# Patient Record
Sex: Female | Born: 1970 | Hispanic: Yes | Marital: Married | Smoking: Never smoker
Health system: Southern US, Community
[De-identification: ages and names within clinical notes are randomized; demographics above are authoritative.]

## PROBLEM LIST (undated history)

## (undated) DIAGNOSIS — L309 Dermatitis, unspecified: Secondary | ICD-10-CM

## (undated) DIAGNOSIS — Z8041 Family history of malignant neoplasm of ovary: Secondary | ICD-10-CM

## (undated) DIAGNOSIS — Z8481 Family history of carrier of genetic disease: Secondary | ICD-10-CM

## (undated) HISTORY — DX: Family history of carrier of genetic disease: Z84.81

## (undated) HISTORY — DX: Dermatitis, unspecified: L30.9

## (undated) HISTORY — PX: FACIAL COSMETIC SURGERY: SHX629

## (undated) HISTORY — DX: Family history of malignant neoplasm of ovary: Z80.41

## (undated) HISTORY — PX: AUGMENTATION MAMMAPLASTY: SUR837

---

## 2013-04-19 ENCOUNTER — Other Ambulatory Visit (HOSPITAL_COMMUNITY): Payer: Self-pay | Admitting: Family Medicine

## 2013-04-19 ENCOUNTER — Ambulatory Visit (HOSPITAL_COMMUNITY)
Admission: RE | Admit: 2013-04-19 | Discharge: 2013-04-19 | Disposition: A | Discharge status: Home / Self Care | Payer: Medicaid Other | Source: Ambulatory Visit | Attending: Family Medicine | Admitting: Family Medicine

## 2013-04-19 DIAGNOSIS — M5137 Other intervertebral disc degeneration, lumbosacral region: Secondary | ICD-10-CM | POA: Insufficient documentation

## 2013-04-19 DIAGNOSIS — M51379 Other intervertebral disc degeneration, lumbosacral region without mention of lumbar back pain or lower extremity pain: Secondary | ICD-10-CM | POA: Insufficient documentation

## 2013-04-19 DIAGNOSIS — R52 Pain, unspecified: Secondary | ICD-10-CM

## 2013-04-19 DIAGNOSIS — M4310 Spondylolisthesis, site unspecified: Secondary | ICD-10-CM | POA: Insufficient documentation

## 2013-05-02 ENCOUNTER — Ambulatory Visit (HOSPITAL_COMMUNITY)
Admission: RE | Admit: 2013-05-02 | Discharge: 2013-05-02 | Disposition: A | Discharge status: Home / Self Care | Payer: Medicaid Other | Source: Ambulatory Visit | Attending: Internal Medicine | Admitting: Internal Medicine

## 2013-05-02 ENCOUNTER — Other Ambulatory Visit (HOSPITAL_COMMUNITY): Payer: Self-pay | Admitting: Internal Medicine

## 2013-05-02 DIAGNOSIS — R52 Pain, unspecified: Secondary | ICD-10-CM

## 2013-05-02 DIAGNOSIS — M542 Cervicalgia: Secondary | ICD-10-CM | POA: Insufficient documentation

## 2015-07-07 ENCOUNTER — Other Ambulatory Visit: Payer: Self-pay | Admitting: Obstetrics and Gynecology

## 2015-07-07 ENCOUNTER — Other Ambulatory Visit (HOSPITAL_COMMUNITY)
Admission: RE | Admit: 2015-07-07 | Discharge: 2015-07-07 | Disposition: A | Discharge status: Home / Self Care | Payer: BLUE CROSS/BLUE SHIELD | Source: Ambulatory Visit | Attending: Obstetrics and Gynecology | Admitting: Obstetrics and Gynecology

## 2015-07-07 ENCOUNTER — Other Ambulatory Visit: Payer: Self-pay

## 2015-07-07 DIAGNOSIS — Z01419 Encounter for gynecological examination (general) (routine) without abnormal findings: Secondary | ICD-10-CM | POA: Diagnosis present

## 2015-07-07 DIAGNOSIS — Z1151 Encounter for screening for human papillomavirus (HPV): Secondary | ICD-10-CM | POA: Insufficient documentation

## 2015-07-07 DIAGNOSIS — Z1231 Encounter for screening mammogram for malignant neoplasm of breast: Secondary | ICD-10-CM

## 2015-07-08 LAB — CYTOLOGY - PAP

## 2015-07-14 ENCOUNTER — Ambulatory Visit
Admission: RE | Admit: 2015-07-14 | Discharge: 2015-07-14 | Disposition: A | Discharge status: Home / Self Care | Payer: BLUE CROSS/BLUE SHIELD | Source: Ambulatory Visit

## 2015-07-14 DIAGNOSIS — Z1231 Encounter for screening mammogram for malignant neoplasm of breast: Secondary | ICD-10-CM

## 2016-07-12 ENCOUNTER — Other Ambulatory Visit: Payer: Self-pay | Admitting: Obstetrics and Gynecology

## 2016-07-12 DIAGNOSIS — Z1231 Encounter for screening mammogram for malignant neoplasm of breast: Secondary | ICD-10-CM

## 2016-07-12 DIAGNOSIS — Z01419 Encounter for gynecological examination (general) (routine) without abnormal findings: Secondary | ICD-10-CM | POA: Diagnosis not present

## 2016-07-19 ENCOUNTER — Ambulatory Visit
Admission: RE | Admit: 2016-07-19 | Discharge: 2016-07-19 | Disposition: A | Discharge status: Home / Self Care | Payer: BLUE CROSS/BLUE SHIELD | Source: Ambulatory Visit | Attending: Obstetrics and Gynecology | Admitting: Obstetrics and Gynecology

## 2016-07-19 DIAGNOSIS — Z1231 Encounter for screening mammogram for malignant neoplasm of breast: Secondary | ICD-10-CM

## 2016-08-23 ENCOUNTER — Encounter: Payer: Self-pay | Admitting: Genetic Counselor

## 2016-08-23 ENCOUNTER — Telehealth: Payer: Self-pay | Admitting: Genetic Counselor

## 2016-08-23 NOTE — Telephone Encounter (Signed)
Pt confirmed appt, verified demo and insurance, mailed pt letter, faxed referring provider appt date/time. °

## 2016-10-04 ENCOUNTER — Ambulatory Visit (HOSPITAL_BASED_OUTPATIENT_CLINIC_OR_DEPARTMENT_OTHER): Payer: BLUE CROSS/BLUE SHIELD | Admitting: Genetic Counselor

## 2016-10-04 ENCOUNTER — Encounter: Payer: Self-pay | Admitting: Genetic Counselor

## 2016-10-04 ENCOUNTER — Other Ambulatory Visit: Payer: BLUE CROSS/BLUE SHIELD

## 2016-10-04 DIAGNOSIS — Z8481 Family history of carrier of genetic disease: Secondary | ICD-10-CM | POA: Insufficient documentation

## 2016-10-04 DIAGNOSIS — Z315 Encounter for genetic counseling: Secondary | ICD-10-CM

## 2016-10-04 DIAGNOSIS — Z8041 Family history of malignant neoplasm of ovary: Secondary | ICD-10-CM | POA: Diagnosis not present

## 2016-10-04 NOTE — Progress Notes (Signed)
REFERRING PROVIDER: Arnoldo Morale, MD [place], [place] [address]   Christophe Louis, MD  PRIMARY PROVIDER:  Arnoldo Morale, MD  PRIMARY REASON FOR VISIT:  1. Family history of breast cancer gene mutation in first degree relative   2. Family history of ovarian cancer      HISTORY OF PRESENT ILLNESS:   Ms. Freeburg, a 45 y.o. female, was seen for a [facility] cancer genetics consultation at the request of Dr. Landry Mellow due to a family history of cancer.  Ms. Cumberland presents to clinic today to discuss the possibility of a hereditary predisposition to cancer, genetic testing, and to further clarify her future cancer risks, as well as potential cancer risks for family members. Ms. Vallely is a 45 y.o. female with no personal history of cancer.    CANCER HISTORY:   No history exists.     HORMONAL RISK FACTORS:  Menarche was at age 4.  First live birth at age 36.  OCP use for approximately 1 years.  Ovaries intact: yes.  Hysterectomy: no.  Menopausal status: premenopausal.  HRT use: 0 years. Colonoscopy: no; not examined. Mammogram within the last year: yes. Number of breast biopsies: 0. Up to date with pelvic exams:  yes. Any excessive radiation exposure in the past:  no  Past Medical History:  Diagnosis Date  . Family history of breast cancer gene mutation in first degree relative   . Family history of ovarian cancer     History reviewed. No pertinent surgical history.  Social History   Social History  . Marital status: Married    Spouse name: N/A  . Number of children: N/A  . Years of education: N/A   Social History Main Topics  . Smoking status: Never Smoker  . Smokeless tobacco: Never Used  . Alcohol use No  . Drug use: No  . Sexual activity: Not Asked   Other Topics Concern  . None   Social History Narrative  . None     FAMILY HISTORY:  We obtained a detailed, 4-generation family history.  Significant diagnoses are listed below: Family History   Problem Relation Age of Onset  . Ovarian cancer Mother 37  . Breast cancer Mother 67  . Lung cancer Maternal Uncle   . Stroke Maternal Grandmother 73  . Thyroid cancer Maternal Grandfather 97  . Heart attack Paternal Grandfather     The patient has three sons who are cancer free.  She has a full sister and maternal half sister who are cancer free.  Her mother was diagnosed with ovarian cancer at 31 and breast cancer at 72.  Her father died at 94 from the flu.  He was in [place] at the time.  Her mother has multiple siblings, but Ms. Holecek is aware of three of them - two brothers and a sister.  One brother was a smoker and died of lung cancer.  Her maternal grandfather died of thyroid cancer at 34 and her grandmother died of a stroke.  Ms. Genco father had two sisters and three brothers, and some may have been half siblings.  She is not aware of cancer on the paternal side of the family.  Ms. Flaim is unaware of previous family history of genetic testing for hereditary cancer risks. Patient's maternal ancestors are of [place] descent, and paternal ancestors are of [place] descent. There is no reported Ashkenazi Jewish ancestry. There is no known consanguinity.  GENETIC COUNSELING ASSESSMENT: Shevawn Langenberg is a 45 y.o.  female with a family history of breast and ovarian cancer which is somewhat suggestive of a hereditary breast cancer syndrome and predisposition to cancer. We, therefore, discussed and recommended the following at today's visit.   DISCUSSION: We discussed that about 5-10% of breast cancer and about 20% of ovarian cancer is due to hereditary syndromes, most commonly BRCA mutations.  We reviewed that there are other genes that can increase the risk for both breast and ovarian cancer.  All of her family was in [place] until 1989.  That is when her mother brought she and her sisters here and settled in [place].  Therefore, they were in [place] during the war.  We discussed that  there could have been chemicals that the family was exposed to that could have caused their cancer.  The only way to determine if her mother developed her cancer due to a hereditary cause is to have her undergo genetic testing.  Ms. Guthridge states that her mother is sick now and she does not want to ask her to undergo genetic testing at this time.  We discussed that her mother is the best person to test, and therefore if Ms. Bastedo is negative, that her mother should undergo genetic testing in order to get clarification of risk not only for Ms. Kanaan but also her sisters.  Ms. Rottman understands and will talk with her mother once she is through her chemotherapy.  In the meantime we can test Ms. Cameron Ali.  We reviewed the characteristics, features and inheritance patterns of hereditary cancer syndromes. We also discussed genetic testing, including the appropriate family members to test, the process of testing, insurance coverage and turn-around-time for results. We discussed the implications of a negative, positive and/or variant of uncertain significant result. We recommended Ms. Foulk pursue genetic testing for the Breast/Ovarian cancer gene panel. The Breast/Ovarian gene panel offered by [facility] includes sequencing and rearrangement analysis for the following 20 genes:  ATM, BARD1, BRCA1, BRCA2, BRIP1, CDH1, CHEK2, EPCAM, FANCC, MLH1, MSH2, MSH6, NBN, PALB2, PMS2, PTEN, RAD51C, RAD51D, TP53, and XRCC2.     Based on Ms. Gopal's personal and family history of cancer, she meets medical criteria for genetic testing. Despite that she meets criteria, she may still have an out of pocket cost. We discussed that if her out of pocket cost for testing is over $100, the laboratory will call and confirm whether she wants to proceed with testing.  If the out of pocket cost of testing is less than $100 she will be billed by the genetic testing laboratory.   PLAN: After considering the risks, benefits, and limitations, Ms. Dick   provided informed consent to pursue genetic testing and the blood sample was sent to [facility] for analysis of the Breast/Ovarian cancer panel. Results should be available within approximately 2-3 weeks' time, at which point they will be disclosed by telephone to Ms. Nikkel, as will any additional recommendations warranted by these results. Ms. Oppenheimer will receive a summary of her genetic counseling visit and a copy of her results once available. This information will also be available in Epic. We encouraged Ms. Cotterman to remain in contact with cancer genetics annually so that we can continuously update the family history and inform her of any changes in cancer genetics and testing that may be of benefit for her family. Ms. Abruzzese questions were answered to her satisfaction today. Our contact information was provided should additional questions or concerns arise.  Lastly, we encouraged Ms. Wrenn to remain in  contact with cancer genetics annually so that we can continuously update the family history and inform her of any changes in cancer genetics and testing that may be of benefit for this family.   Ms.  Donaway questions were answered to her satisfaction today. Our contact information was provided should additional questions or concerns arise. Thank you for the referral and allowing [place] to share in the care of your patient.   Aithana Kushner P. Florene Glen, [place], [facility] Certified Genetic Counselor [email or web address] phone: [phone]  The patient was seen for a total of 45 minutes in face-to-face genetic counseling.  This patient was discussed with Drs. Magrinat, Lindi Adie and/or Burr Medico who agrees with the above.    _______________________________________________________________________ For Office Staff:  Number of people involved in session: 1 Was an Intern/ student involved with case: no

## 2016-10-20 DIAGNOSIS — Z803 Family history of malignant neoplasm of breast: Secondary | ICD-10-CM | POA: Diagnosis not present

## 2016-10-20 DIAGNOSIS — Z8041 Family history of malignant neoplasm of ovary: Secondary | ICD-10-CM | POA: Diagnosis not present

## 2016-10-20 DIAGNOSIS — Z8481 Family history of carrier of genetic disease: Secondary | ICD-10-CM | POA: Diagnosis not present

## 2016-11-09 ENCOUNTER — Telehealth: Payer: Self-pay | Admitting: Genetic Counselor

## 2016-11-09 ENCOUNTER — Encounter: Payer: Self-pay | Admitting: Genetic Counselor

## 2016-11-09 DIAGNOSIS — Z1379 Encounter for other screening for genetic and chromosomal anomalies: Secondary | ICD-10-CM | POA: Insufficient documentation

## 2016-11-09 NOTE — Telephone Encounter (Signed)
No answer and no VM.  It just rang and rang.

## 2016-11-12 ENCOUNTER — Telehealth: Payer: Self-pay | Admitting: Genetic Counselor

## 2016-11-12 NOTE — Telephone Encounter (Signed)
Revealed negative genetic testing.  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  We do not change medical management based on this result.  We will let her know when it is reclassified.

## 2016-11-23 ENCOUNTER — Ambulatory Visit: Payer: Self-pay | Admitting: Genetic Counselor

## 2016-11-23 DIAGNOSIS — Z8041 Family history of malignant neoplasm of ovary: Secondary | ICD-10-CM

## 2016-11-23 DIAGNOSIS — Z1379 Encounter for other screening for genetic and chromosomal anomalies: Secondary | ICD-10-CM

## 2016-11-23 DIAGNOSIS — Z8481 Family history of carrier of genetic disease: Secondary | ICD-10-CM

## 2016-11-23 NOTE — Progress Notes (Signed)
HPI: Kelli Campbell was previously seen in the [facility] due to a family history of cancer and concerns regarding a hereditary predisposition to cancer. Please refer to our prior cancer genetics clinic note for more information regarding Kelli Campbell's medical, social and family histories, and our assessment and recommendations, at the time. Kelli Campbell's recent genetic test results were disclosed to her, as were recommendations warranted by these results. These results and recommendations are discussed in more detail below.  CANCER HISTORY:   No history exists.    FAMILY HISTORY:  We obtained a detailed, 4-generation family history.  Significant diagnoses are listed below: Family History  Problem Relation Age of Onset  . Ovarian cancer Mother 45  . Breast cancer Mother 62  . Lung cancer Maternal Uncle   . Stroke Maternal Grandmother 73  . Thyroid cancer Maternal Grandfather 55  . Heart attack Paternal Grandfather     The patient has three sons who are cancer free.  She has a full sister and maternal half sister who are cancer free.  Her mother was diagnosed with ovarian cancer at 45 and breast cancer at 62.  Her father died at 28 from the flu.  He was in [place] at the time.  Her mother has multiple siblings, but Kelli Campbell is aware of three of them - two brothers and a sister.  One brother was a smoker and died of lung cancer.  Her maternal grandfather died of thyroid cancer at 55 and her grandmother died of a stroke.  Kelli Campbell's father had two sisters and three brothers, and some may have been half siblings.  She is not aware of cancer on the paternal side of the family.  Kelli Campbell is unaware of previous family history of genetic testing for hereditary cancer risks. Patient's maternal ancestors are of [place] descent, and paternal ancestors are of [place] descent. There is no reported Ashkenazi Jewish ancestry. There is no known consanguinity.  GENETIC TEST  RESULTS: Genetic testing reported out on November 01, 2016 through the Breast/Ovarian cancer panel found no deleterious mutations.  The Breast/Ovarian gene panel offered by [facility] includes sequencing and rearrangement analysis for the following 20 genes:  ATM, BARD1, BRCA1, BRCA2, BRIP1, CDH1, CHEK2, EPCAM, FANCC, MLH1, MSH2, MSH6, NBN, PALB2, PMS2, PTEN, RAD51C, RAD51D, TP53, and XRCC2.   The test report has been scanned into EPIC and is located under the Molecular Pathology section of the Results Review tab.   We discussed with Kelli Campbell that since the current genetic testing is not perfect, it is possible there may be a gene mutation in one of these genes that current testing cannot detect, but that chance is small. We also discussed, that it is possible that another gene that has not yet been discovered, or that we have not yet tested, is responsible for the cancer diagnoses in the family, and it is, therefore, important to remain in touch with cancer genetics in the future so that we can continue to offer Kelli Campbell the most up to date genetic testing.   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 At this time, it is unknown if this variant is associated with increased cancer risk or if this is a normal finding, but most variants such as this get reclassified to being inconsequential. It should not be used to make medical management decisions. With time, we suspect the lab will determine the significance of this variant, if   any. If we do learn more about it, we will try to contact Kelli Campbell to discuss it further. However, it is important to stay in touch with [place] periodically and keep the address and phone number up to date.   CANCER SCREENING RECOMMENDATIONS: This normal result is reassuring and indicates that Kelli Campbell does not likely have an increased risk of cancer due to a mutation in one of these genes.  We, therefore, recommended  Kelli Campbell  continue to follow the cancer screening guidelines provided by her primary healthcare providers.   RECOMMENDATIONS FOR FAMILY MEMBERS: Women in this family might be at some increased risk of developing cancer, over the general population risk, simply due to the family history of cancer. We recommended women in this family have a yearly mammogram beginning at age 16, or 46 years younger than the earliest onset of cancer, an annual clinical breast exam, and perform monthly breast self-exams. Women in this family should also have a gynecological exam as recommended by their primary provider. All family members should have a colonoscopy by age 13.  Based on Kelli Campbell's family history, we recommended her sisters have genetic counseling and testing based on the family history alone. Kelli Campbell will let us know if we can be of any assistance in coordinating genetic counseling and/or testing for this family member.   FOLLOW-UP: Lastly, we discussed with Kelli Campbell that cancer genetics is a rapidly advancing field and it is possible that new genetic tests will be appropriate for her and/or her family members in the future. We encouraged her to remain in contact with cancer genetics on an annual basis so we can update her personal and family histories and let her know of advances in cancer genetics that may benefit this family.   Our contact number was provided. Kelli Campbell questions were answered to her satisfaction, and she knows she is welcome to call us at anytime with additional questions or concerns.   Roma Kayser, MS, [facility] Certified Genetic Counselor [email or web address]

## 2017-06-14 ENCOUNTER — Other Ambulatory Visit: Payer: Self-pay | Admitting: Obstetrics and Gynecology

## 2017-06-14 DIAGNOSIS — Z1231 Encounter for screening mammogram for malignant neoplasm of breast: Secondary | ICD-10-CM

## 2017-07-18 DIAGNOSIS — Z01419 Encounter for gynecological examination (general) (routine) without abnormal findings: Secondary | ICD-10-CM | POA: Diagnosis not present

## 2017-07-20 ENCOUNTER — Ambulatory Visit
Admission: RE | Admit: 2017-07-20 | Discharge: 2017-07-20 | Disposition: A | Discharge status: Home / Self Care | Payer: BLUE CROSS/BLUE SHIELD | Source: Ambulatory Visit | Attending: Obstetrics and Gynecology | Admitting: Obstetrics and Gynecology

## 2017-07-20 DIAGNOSIS — Z1231 Encounter for screening mammogram for malignant neoplasm of breast: Secondary | ICD-10-CM

## 2017-09-26 DIAGNOSIS — R5383 Other fatigue: Secondary | ICD-10-CM | POA: Diagnosis not present

## 2017-09-26 DIAGNOSIS — R590 Localized enlarged lymph nodes: Secondary | ICD-10-CM | POA: Diagnosis not present

## 2018-06-30 ENCOUNTER — Other Ambulatory Visit: Payer: Self-pay | Admitting: Obstetrics and Gynecology

## 2018-06-30 DIAGNOSIS — Z1231 Encounter for screening mammogram for malignant neoplasm of breast: Secondary | ICD-10-CM

## 2018-07-24 ENCOUNTER — Other Ambulatory Visit (HOSPITAL_COMMUNITY)
Admission: RE | Admit: 2018-07-24 | Discharge: 2018-07-24 | Disposition: A | Discharge status: Home / Self Care | Payer: BLUE CROSS/BLUE SHIELD | Source: Ambulatory Visit | Attending: Obstetrics and Gynecology | Admitting: Obstetrics and Gynecology

## 2018-07-24 ENCOUNTER — Other Ambulatory Visit: Payer: Self-pay | Admitting: Obstetrics and Gynecology

## 2018-07-24 DIAGNOSIS — Z01419 Encounter for gynecological examination (general) (routine) without abnormal findings: Secondary | ICD-10-CM | POA: Insufficient documentation

## 2018-07-26 LAB — CYTOLOGY - PAP
Diagnosis: NEGATIVE
HPV: NOT DETECTED

## 2018-07-31 ENCOUNTER — Ambulatory Visit
Admission: RE | Admit: 2018-07-31 | Discharge: 2018-07-31 | Disposition: A | Discharge status: Home / Self Care | Payer: BLUE CROSS/BLUE SHIELD | Source: Ambulatory Visit | Attending: Obstetrics and Gynecology | Admitting: Obstetrics and Gynecology

## 2018-07-31 DIAGNOSIS — Z1231 Encounter for screening mammogram for malignant neoplasm of breast: Secondary | ICD-10-CM

## 2019-09-06 ENCOUNTER — Other Ambulatory Visit: Payer: Self-pay | Admitting: Obstetrics and Gynecology

## 2019-09-06 DIAGNOSIS — Z1231 Encounter for screening mammogram for malignant neoplasm of breast: Secondary | ICD-10-CM

## 2019-09-10 DIAGNOSIS — Z01419 Encounter for gynecological examination (general) (routine) without abnormal findings: Secondary | ICD-10-CM | POA: Diagnosis not present

## 2019-11-05 ENCOUNTER — Ambulatory Visit: Payer: BLUE CROSS/BLUE SHIELD

## 2019-12-10 ENCOUNTER — Ambulatory Visit: Payer: Self-pay

## 2019-12-24 ENCOUNTER — Other Ambulatory Visit: Payer: Self-pay

## 2019-12-24 ENCOUNTER — Ambulatory Visit
Admission: RE | Admit: 2019-12-24 | Discharge: 2019-12-24 | Disposition: A | Discharge status: Home / Self Care | Payer: BC Managed Care – PPO | Source: Ambulatory Visit | Attending: Obstetrics and Gynecology | Admitting: Obstetrics and Gynecology

## 2019-12-24 DIAGNOSIS — Z1231 Encounter for screening mammogram for malignant neoplasm of breast: Secondary | ICD-10-CM | POA: Diagnosis not present

## 2020-09-23 DIAGNOSIS — Z01419 Encounter for gynecological examination (general) (routine) without abnormal findings: Secondary | ICD-10-CM | POA: Diagnosis not present

## 2020-09-23 DIAGNOSIS — Z13220 Encounter for screening for lipoid disorders: Secondary | ICD-10-CM | POA: Diagnosis not present

## 2020-09-23 DIAGNOSIS — D649 Anemia, unspecified: Secondary | ICD-10-CM | POA: Diagnosis not present

## 2021-04-06 ENCOUNTER — Other Ambulatory Visit: Payer: Self-pay | Admitting: Obstetrics and Gynecology

## 2021-04-06 DIAGNOSIS — Z1231 Encounter for screening mammogram for malignant neoplasm of breast: Secondary | ICD-10-CM

## 2021-04-08 ENCOUNTER — Other Ambulatory Visit: Payer: Self-pay

## 2021-04-08 ENCOUNTER — Ambulatory Visit
Admission: RE | Admit: 2021-04-08 | Discharge: 2021-04-08 | Disposition: A | Discharge status: Home / Self Care | Payer: Managed Care, Other (non HMO) | Source: Ambulatory Visit | Attending: Obstetrics and Gynecology | Admitting: Obstetrics and Gynecology

## 2021-04-08 DIAGNOSIS — Z1231 Encounter for screening mammogram for malignant neoplasm of breast: Secondary | ICD-10-CM

## 2021-11-17 IMAGING — MG DIGITAL SCREENING BREAST BILAT IMPLANT W/ TOMO W/ CAD
9 of 12 series · 9 of 28 positions shown · non-contrast
Comparison: Previous exam(s).

CLINICAL DATA: Screening.

EXAM:
DIGITAL SCREENING BILATERAL MAMMOGRAM WITH IMPLANTS, CAD AND
TOMOSYNTHESIS
TECHNIQUE: Bilateral screening digital craniocaudal and mediolateral oblique
mammograms were obtained. Bilateral screening digital breast
tomosynthesis was performed. The images were evaluated with
computer-aided detection. Standard and/or implant displaced views
were performed.

[L CC]
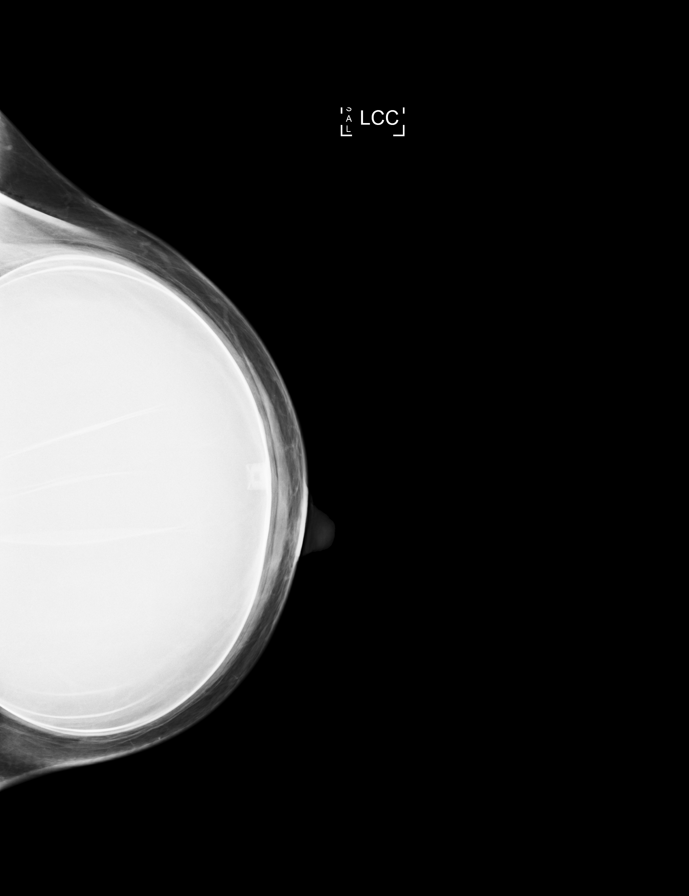

[R CC]
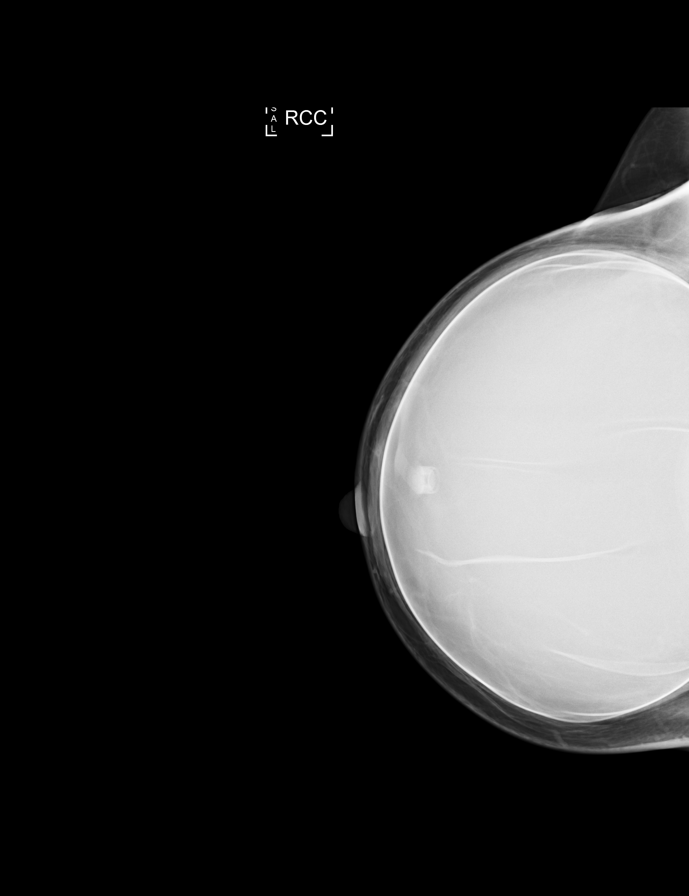

[L MLO]
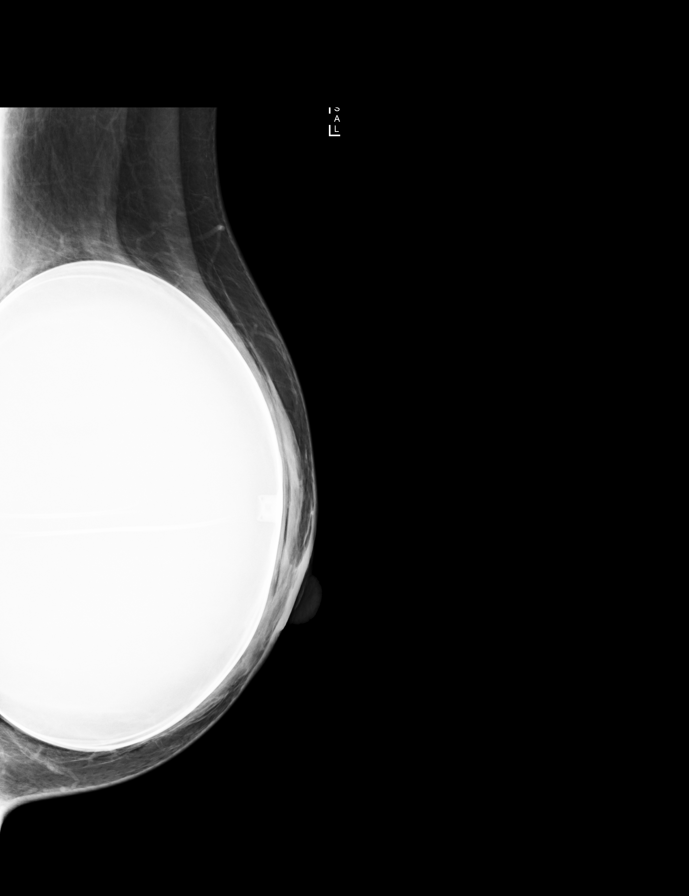

[R MLO]
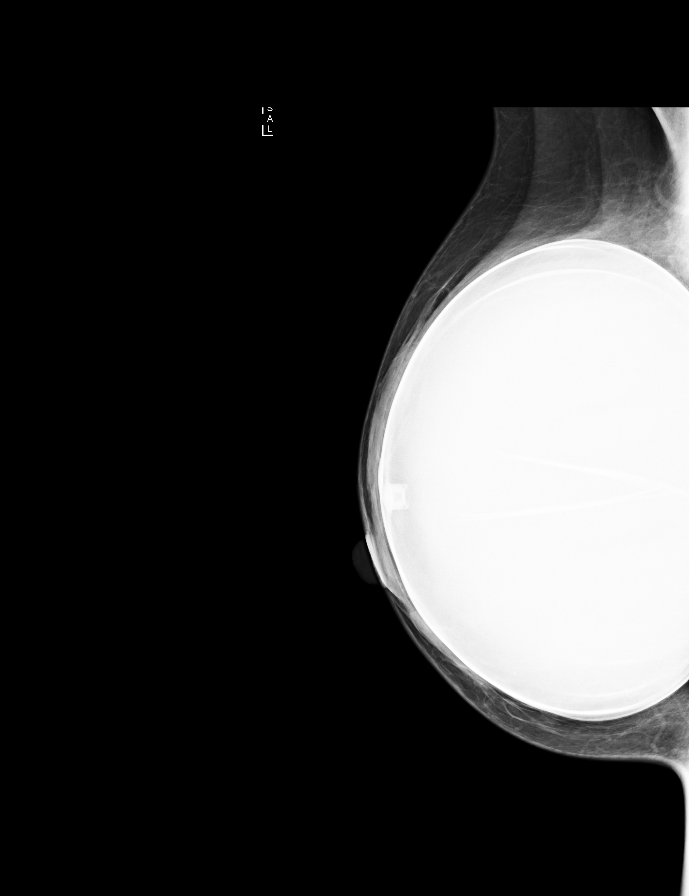

[L MLO synth-2D]
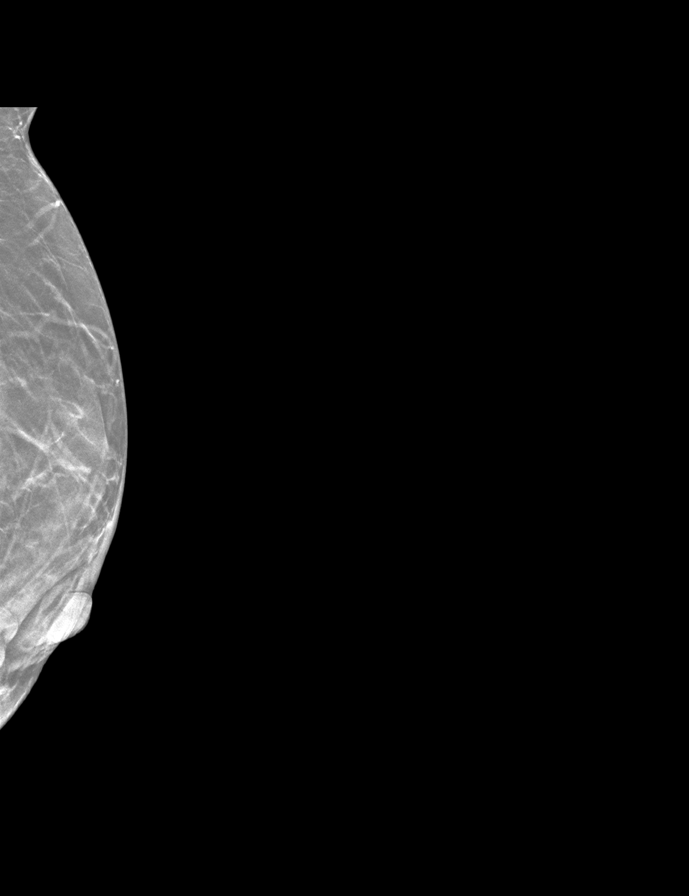

[R MLO synth-2D]
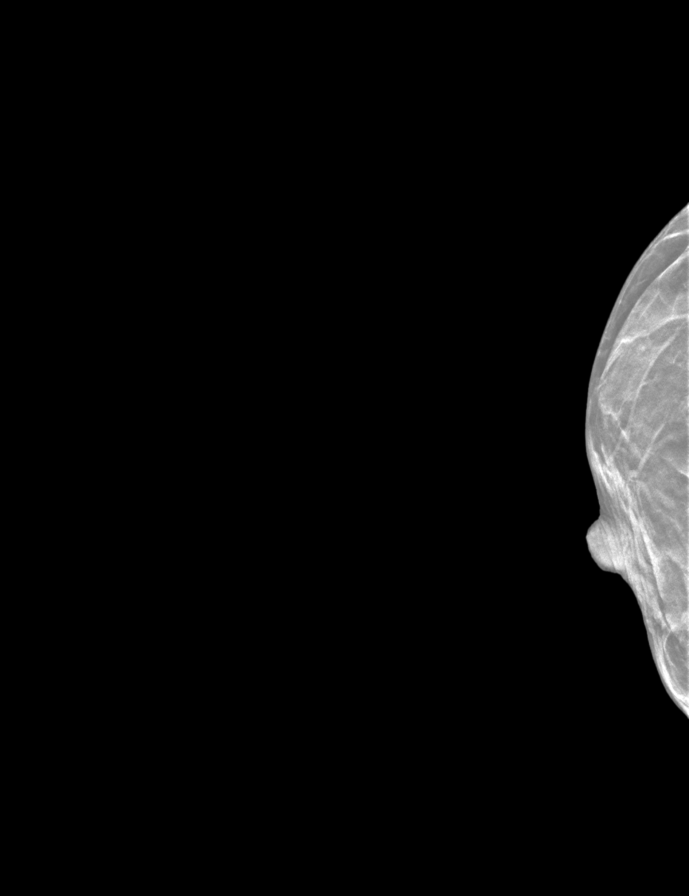

[L CC synth-2D]
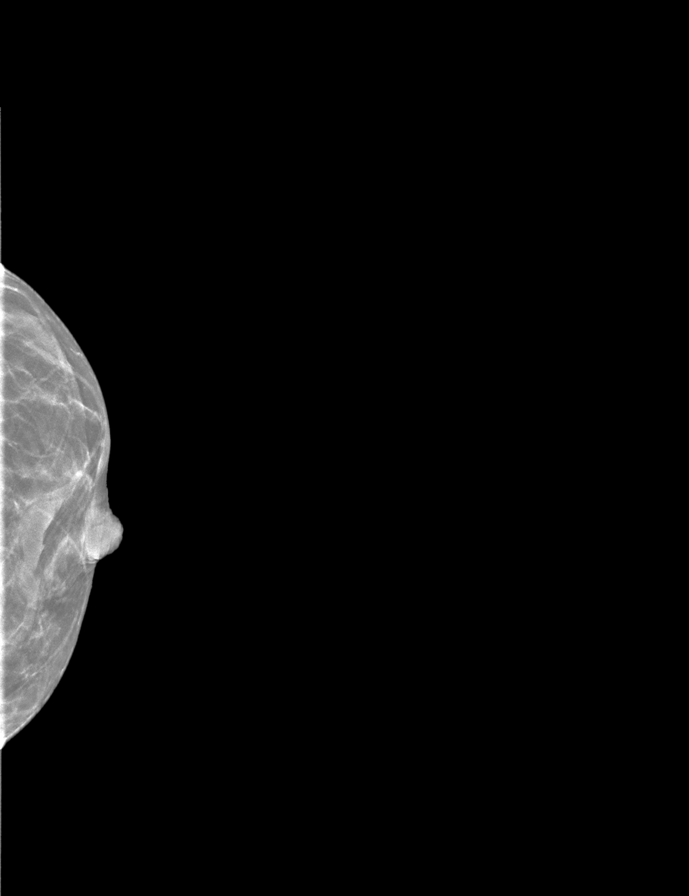

[R CC synth-2D]
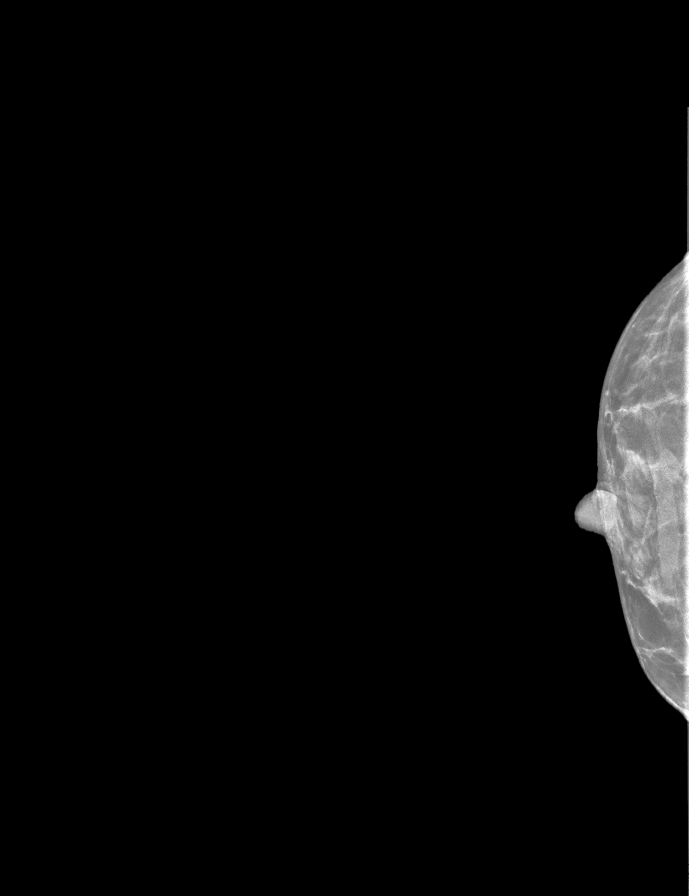

[L CCID BREAST TOMOSYNTHESIS IMAGE tomo · tomo slice 15/30.0]
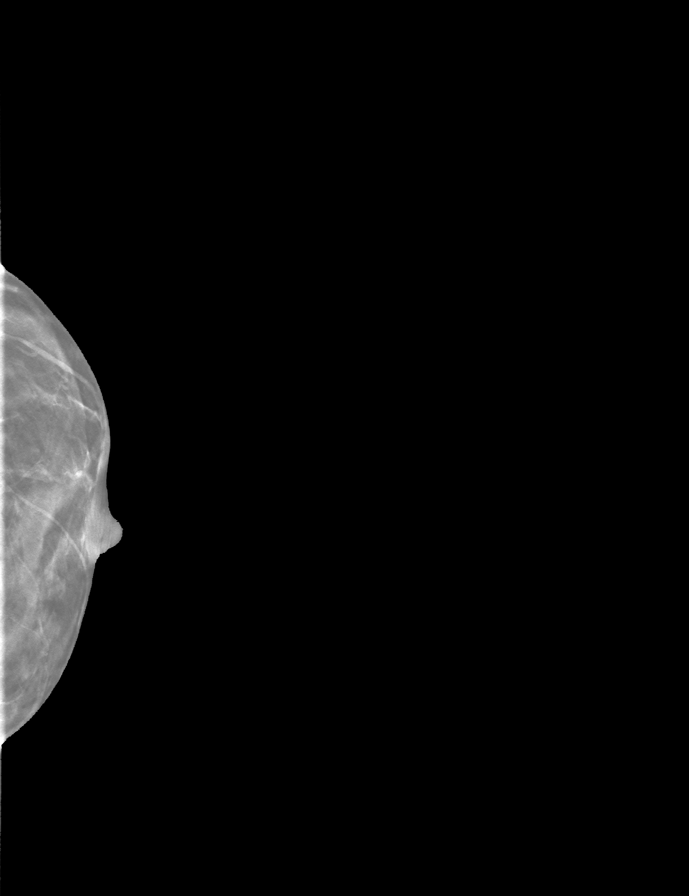

[9 of 28 positions shown; findings below may reference images not displayed]

ACR Breast Density Category c: The breast tissue is heterogeneously
dense, which may obscure small masses.
FINDINGS: The patient has retropectoral implants. There are no findings
suspicious for malignancy.
IMPRESSION: No mammographic evidence of malignancy. A result letter of this
screening mammogram will be mailed directly to the patient.

RECOMMENDATION:
Screening mammogram in one year. (Code:LT-E-7TH)

BI-RADS CATEGORY  1:  Negative.

## 2022-02-15 ENCOUNTER — Other Ambulatory Visit: Payer: Self-pay | Admitting: Obstetrics and Gynecology

## 2022-02-15 ENCOUNTER — Other Ambulatory Visit (HOSPITAL_COMMUNITY)
Admission: RE | Admit: 2022-02-15 | Discharge: 2022-02-15 | Disposition: A | Discharge status: Home / Self Care | Payer: Self-pay | Source: Ambulatory Visit | Attending: Obstetrics and Gynecology | Admitting: Obstetrics and Gynecology

## 2022-02-15 DIAGNOSIS — Z01419 Encounter for gynecological examination (general) (routine) without abnormal findings: Secondary | ICD-10-CM | POA: Insufficient documentation

## 2022-02-15 DIAGNOSIS — N939 Abnormal uterine and vaginal bleeding, unspecified: Secondary | ICD-10-CM | POA: Diagnosis not present

## 2022-02-15 DIAGNOSIS — K640 First degree hemorrhoids: Secondary | ICD-10-CM | POA: Diagnosis not present

## 2022-02-18 LAB — CYTOLOGY - PAP
Comment: NEGATIVE
Diagnosis: NEGATIVE
High risk HPV: NEGATIVE

## 2022-03-04 DIAGNOSIS — Z13220 Encounter for screening for lipoid disorders: Secondary | ICD-10-CM | POA: Diagnosis not present

## 2022-03-04 DIAGNOSIS — Z23 Encounter for immunization: Secondary | ICD-10-CM | POA: Diagnosis not present

## 2022-03-04 DIAGNOSIS — N83291 Other ovarian cyst, right side: Secondary | ICD-10-CM | POA: Diagnosis not present

## 2022-03-04 DIAGNOSIS — N939 Abnormal uterine and vaginal bleeding, unspecified: Secondary | ICD-10-CM | POA: Diagnosis not present

## 2022-03-04 DIAGNOSIS — M545 Low back pain, unspecified: Secondary | ICD-10-CM | POA: Diagnosis not present

## 2022-03-04 DIAGNOSIS — Z0000 Encounter for general adult medical examination without abnormal findings: Secondary | ICD-10-CM | POA: Diagnosis not present

## 2022-05-03 ENCOUNTER — Other Ambulatory Visit: Payer: Self-pay | Admitting: Obstetrics and Gynecology

## 2022-05-03 DIAGNOSIS — Z1231 Encounter for screening mammogram for malignant neoplasm of breast: Secondary | ICD-10-CM

## 2022-05-06 DIAGNOSIS — L729 Follicular cyst of the skin and subcutaneous tissue, unspecified: Secondary | ICD-10-CM | POA: Diagnosis not present

## 2022-05-06 DIAGNOSIS — N83201 Unspecified ovarian cyst, right side: Secondary | ICD-10-CM | POA: Diagnosis not present

## 2022-05-11 ENCOUNTER — Ambulatory Visit
Admission: RE | Admit: 2022-05-11 | Discharge: 2022-05-11 | Disposition: A | Discharge status: Home / Self Care | Payer: BC Managed Care – PPO | Source: Ambulatory Visit | Attending: Obstetrics and Gynecology | Admitting: Obstetrics and Gynecology

## 2022-05-11 DIAGNOSIS — Z1231 Encounter for screening mammogram for malignant neoplasm of breast: Secondary | ICD-10-CM | POA: Diagnosis not present

## 2024-07-30 ENCOUNTER — Encounter: Payer: Self-pay | Admitting: Nurse Practitioner

## 2024-07-30 ENCOUNTER — Ambulatory Visit: Admitting: Nurse Practitioner

## 2024-07-30 ENCOUNTER — Ambulatory Visit: Payer: Self-pay | Admitting: Nurse Practitioner

## 2024-07-30 VITALS — BP 110/84 | HR 66 | Temp 97.5°F | Ht <= 58 in | Wt 104.2 lb

## 2024-07-30 DIAGNOSIS — Z1231 Encounter for screening mammogram for malignant neoplasm of breast: Secondary | ICD-10-CM

## 2024-07-30 DIAGNOSIS — Z136 Encounter for screening for cardiovascular disorders: Secondary | ICD-10-CM

## 2024-07-30 DIAGNOSIS — Z1211 Encounter for screening for malignant neoplasm of colon: Secondary | ICD-10-CM

## 2024-07-30 DIAGNOSIS — M5442 Lumbago with sciatica, left side: Secondary | ICD-10-CM | POA: Diagnosis not present

## 2024-07-30 DIAGNOSIS — L309 Dermatitis, unspecified: Secondary | ICD-10-CM | POA: Diagnosis not present

## 2024-07-30 DIAGNOSIS — Z0000 Encounter for general adult medical examination without abnormal findings: Secondary | ICD-10-CM

## 2024-07-30 DIAGNOSIS — Z1159 Encounter for screening for other viral diseases: Secondary | ICD-10-CM

## 2024-07-30 DIAGNOSIS — D649 Anemia, unspecified: Secondary | ICD-10-CM

## 2024-07-30 DIAGNOSIS — Z114 Encounter for screening for human immunodeficiency virus [HIV]: Secondary | ICD-10-CM

## 2024-07-30 DIAGNOSIS — G8929 Other chronic pain: Secondary | ICD-10-CM

## 2024-07-30 LAB — LIPID PANEL
Cholesterol: 187 mg/dL (ref 0–200)
HDL: 68 mg/dL (ref >39.00)
LDL Cholesterol: 110 mg/dL — ABNORMAL HIGH (ref 0–99)
NonHDL: 118.87
Total CHOL/HDL Ratio: 3
Triglycerides: 45 mg/dL (ref 0.0–149.0)
VLDL: 9 mg/dL (ref 0.0–40.0)

## 2024-07-30 LAB — CBC WITH DIFFERENTIAL/PLATELET
Basophils Absolute: 0 K/uL (ref 0.0–0.1)
Basophils Relative: 0.3 % (ref 0.0–3.0)
Eosinophils Absolute: 0 K/uL (ref 0.0–0.7)
Eosinophils Relative: 0.8 % (ref 0.0–5.0)
HCT: 35.2 % — ABNORMAL LOW (ref 36.0–46.0)
Hemoglobin: 11.4 g/dL — ABNORMAL LOW (ref 12.0–15.0)
Lymphocytes Relative: 32.3 % (ref 12.0–46.0)
Lymphs Abs: 1.6 K/uL (ref 0.7–4.0)
MCHC: 32.4 g/dL (ref 30.0–36.0)
MCV: 79 fl (ref 78.0–100.0)
Monocytes Absolute: 0.3 K/uL (ref 0.1–1.0)
Monocytes Relative: 6.7 % (ref 3.0–12.0)
Neutro Abs: 3 K/uL (ref 1.4–7.7)
Neutrophils Relative %: 59.9 % (ref 43.0–77.0)
Platelets: 277 K/uL (ref 150.0–400.0)
RBC: 4.46 Mil/uL (ref 3.87–5.11)
RDW: 14.7 % (ref 11.5–15.5)
WBC: 4.9 K/uL (ref 4.0–10.5)

## 2024-07-30 LAB — COMPREHENSIVE METABOLIC PANEL WITH GFR
ALT: 12 U/L (ref 0–35)
AST: 15 U/L (ref 0–37)
Albumin: 4.4 g/dL (ref 3.5–5.2)
Alkaline Phosphatase: 79 U/L (ref 39–117)
BUN: 9 mg/dL (ref 6–23)
CO2: 27 meq/L (ref 19–32)
Calcium: 9.2 mg/dL (ref 8.4–10.5)
Chloride: 104 meq/L (ref 96–112)
Creatinine, Ser: 0.42 mg/dL (ref 0.40–1.20)
GFR: 113.52 mL/min (ref >60.00)
Glucose, Bld: 88 mg/dL (ref 70–99)
Potassium: 3.5 meq/L (ref 3.5–5.1)
Sodium: 140 meq/L (ref 135–145)
Total Bilirubin: 0.7 mg/dL (ref 0.2–1.2)
Total Protein: 7.3 g/dL (ref 6.0–8.3)

## 2024-07-30 MED ORDER — TRIAMCINOLONE ACETONIDE 0.1 % EX CREA: 1.0000 | TOPICAL_CREAM | Freq: Two times a day (BID) | CUTANEOUS | 2 refills | Status: DC

## 2024-07-30 NOTE — Patient Instructions (Addendum)
 It was great to see you!  We are checking your labs today and will let you know the results via mychart/phone.   Come back on October 20 at 9 am for mammogram   You can look into shingles vaccine (shingrix)  Let's follow-up in 1 year, sooner if you have concerns.  If a referral was placed today, you will be contacted for an appointment. Please note that routine referrals can sometimes take up to 3-4 weeks to process. Please call our office if you haven't heard anything after this time frame.  Take care,  Tinnie Harada, NP

## 2024-07-30 NOTE — Addendum Note (Signed)
 Addended by: ALTO PARODY D on: 07/30/2024 05:11 PM   Modules accepted: Orders

## 2024-07-30 NOTE — Assessment & Plan Note (Signed)
 Chronic eczema presents with sensitive skin, frequent itching, and rash. Send prescription for triamcinolone cream daily as needed

## 2024-07-30 NOTE — Assessment & Plan Note (Signed)
 Chronic, stable. She has experienced chronic low back pain with radiculopathy for over 20 years, with numbness and pain radiating to the leg. Managed with acupuncture and therapy when in [place].

## 2024-07-30 NOTE — Progress Notes (Signed)
 New Patient Visit  BP 110/84 (BP Location: Left Arm, Patient Position: Sitting, Cuff Size: Small)   Pulse 66   Temp (!) 97.5 F (36.4 C)   Ht 4' 10 (1.473 m)   Wt 104 lb 3.2 oz (47.3 kg)   LMP 07/14/2024 (Exact Date)   SpO2 100%   BMI 21.78 kg/m    Subjective:    Patient ID: Kelli Campbell, female    DOB: [birth year], 53 y.o.   MRN: [id number]  CC: Chief Complaint  Patient presents with   Establish Care    NP. Est. Care, request lab work    HPI: Kelli Campbell is a 53 y.o. female presents for new patient visit to establish care.  Introduced to Publishing rights manager role and practice setting.  All questions answered.  Discussed provider/patient relationship and expectations.  Discussed the use of AI scribe software for clinical note transcription with the patient, who gave verbal consent to proceed.  History of Present Illness   Kelli Campbell is a 53 year old female who presents to establish care with no concerns. She has not been to a medical provider in over a year due to lapse in insurance.   She has a history of eczema which causes her skin to be sensitive, requiring triamcinolone cream for management.  She has chronic back pain radiating to her left leg for over 20 years, relieved by acupuncture and therapy in [place]. Current pain is mild.     Depression and Anxiety Screen done:     07/30/2024   10:21 AM  Depression screen PHQ 2/9  Decreased Interest 0  Down, Depressed, Hopeless 0  PHQ - 2 Score 0  Altered sleeping 0  Tired, decreased energy 1  Change in appetite 0  Feeling bad or failure about yourself  0  Trouble concentrating 0  Moving slowly or fidgety/restless 0  Suicidal thoughts 0  PHQ-9 Score 1  Difficult doing work/chores Not difficult at all      07/30/2024   10:22 AM  GAD 7 : Generalized Anxiety Score  Nervous, Anxious, on Edge 0  Control/stop worrying 0  Worry too much - different things 0  Trouble relaxing 0  Restless 0   Easily annoyed or irritable 0  Afraid - awful might happen 0  Total GAD 7 Score 0  Anxiety Difficulty Not difficult at all    Past Medical History:  Diagnosis Date   Eczema    Family history of breast cancer gene mutation in first degree relative    Family history of ovarian cancer     Past Surgical History:  Procedure Laterality Date   AUGMENTATION MAMMAPLASTY     FACIAL COSMETIC SURGERY      Family History  Problem Relation Age of Onset   Ovarian cancer Mother 59   Breast cancer Mother 67   Lung cancer Maternal Uncle    Stroke Maternal Grandmother 57   Thyroid  cancer Maternal Grandfather 55   Heart attack Paternal Grandfather      Social History   Tobacco Use   Smoking status: Never   Smokeless tobacco: Never  Vaping Use   Vaping status: Never Used  Substance Use Topics   Alcohol use: No   Drug use: No    No current outpatient medications on file prior to visit.   No current facility-administered medications on file prior to visit.     Review of Systems  Constitutional: Negative.   HENT: Negative.    Eyes:  Negative.   Respiratory: Negative.    Cardiovascular: Negative.   Gastrointestinal: Negative.   Endocrine: Negative.   Genitourinary: Negative.   Musculoskeletal:  Positive for back pain (chronic, stable).  Skin: Negative.   Neurological: Negative.   Psychiatric/Behavioral: Negative.          Objective:    BP 110/84 (BP Location: Left Arm, Patient Position: Sitting, Cuff Size: Small)   Pulse 66   Temp (!) 97.5 F (36.4 C)   Ht 4' 10 (1.473 m)   Wt 104 lb 3.2 oz (47.3 kg)   LMP 07/14/2024 (Exact Date)   SpO2 100%   BMI 21.78 kg/m   Wt Readings from Last 3 Encounters:  07/30/24 104 lb 3.2 oz (47.3 kg)    BP Readings from Last 3 Encounters:  07/30/24 110/84    Physical Exam Vitals and nursing note reviewed.  Constitutional:      General: She is not in acute distress.    Appearance: Normal appearance.  HENT:     Head:  Normocephalic and atraumatic.     Right Ear: Tympanic membrane, ear canal and external ear normal.     Left Ear: Tympanic membrane, ear canal and external ear normal.     Mouth/Throat:     Mouth: Mucous membranes are moist.     Pharynx: No posterior oropharyngeal erythema.  Eyes:     Conjunctiva/sclera: Conjunctivae normal.  Cardiovascular:     Rate and Rhythm: Normal rate and regular rhythm.     Pulses: Normal pulses.     Heart sounds: Normal heart sounds.  Pulmonary:     Effort: Pulmonary effort is normal.     Breath sounds: Normal breath sounds.  Abdominal:     Palpations: Abdomen is soft.     Tenderness: There is no abdominal tenderness.  Musculoskeletal:        General: Normal range of motion.     Cervical back: Normal range of motion and neck supple.     Right lower leg: No edema.     Left lower leg: No edema.  Lymphadenopathy:     Cervical: No cervical adenopathy.  Skin:    General: Skin is warm and dry.  Neurological:     General: No focal deficit present.     Mental Status: She is alert and oriented to person, place, and time.     Cranial Nerves: No cranial nerve deficit.     Coordination: Coordination normal.     Gait: Gait normal.  Psychiatric:        Mood and Affect: Mood normal.        Behavior: Behavior normal.        Thought Content: Thought content normal.        Judgment: Judgment normal.        Assessment & Plan:   Problem List Items Addressed This Visit       Nervous and Auditory   Chronic left-sided low back pain with left-sided sciatica   Chronic, stable. She has experienced chronic low back pain with radiculopathy for over 20 years, with numbness and pain radiating to the leg. Managed with acupuncture and therapy when in [place].         Musculoskeletal and Integument   Eczema   Chronic eczema presents with sensitive skin, frequent itching, and rash. Send prescription for triamcinolone cream daily as needed        Other   Routine  general medical examination at a health care facility - Primary  Health maintenance reviewed and updated. Discussed nutrition, exercise. Check CMP, CBC today. Follow-up 1 year.        Relevant Orders   CBC with Differential/Platelet   Comprehensive metabolic panel with GFR   Other Visit Diagnoses       Encounter for screening mammogram for malignant neoplasm of breast       Mammogram ordered today   Relevant Orders   MM 3D SCREENING MAMMOGRAM BILATERAL BREAST W/IMPLANT     Screening for cardiovascular condition       Screen lipid panel today   Relevant Orders   Lipid panel     Screening for HIV (human immunodeficiency virus)       Screen HIV today   Relevant Orders   HIV Antibody (routine testing w rflx)     Screen for colon cancer       Referral placed to GI   Relevant Orders   Ambulatory referral to Gastroenterology     Encounter for hepatitis C screening test for low risk patient       Screen hepatitis C today   Relevant Orders   Hepatitis C antibody       LABORATORY TESTING:  - Pap smear: up to date  IMMUNIZATIONS:   - Tdap: Tetanus vaccination status reviewed: last tetanus booster within 10 years. - Influenza: Declined - Pneumovax: Not applicable - Prevnar: Declined - HPV: Not applicable - Shingrix vaccine: Declined  SCREENING: -Mammogram: Ordered today  - Colonoscopy: Ordered today  - Bone Density: Not applicable   PATIENT COUNSELING:   Advised to take 1 mg of folate supplement per day if capable of pregnancy.   Sexuality: Discussed sexually transmitted diseases, partner selection, use of condoms, avoidance of unintended pregnancy  and contraceptive alternatives.   Advised to avoid cigarette smoking.  I discussed with the patient that most people either abstain from alcohol or drink within safe limits (<=14/week and <=4 drinks/occasion for males, <=7/weeks and <= 3 drinks/occasion for females) and that the risk for alcohol disorders and other health  effects rises proportionally with the number of drinks per week and how often a drinker exceeds daily limits.  Discussed cessation/primary prevention of drug use and availability of treatment for abuse.   Diet: Encouraged to adjust caloric intake to maintain  or achieve ideal body weight, to reduce intake of dietary saturated fat and total fat, to limit sodium intake by avoiding high sodium foods and not adding table salt, and to maintain adequate dietary potassium and calcium preferably from fresh fruits, vegetables, and low-fat dairy products.    stressed the importance of regular exercise  Injury prevention: Discussed safety belts, safety helmets, smoke detector, smoking near bedding or upholstery.   Dental health: Discussed importance of regular tooth brushing, flossing, and dental visits.    NEXT PREVENTATIVE PHYSICAL DUE IN 1 YEAR.  Follow up plan: Return in about 1 year (around 07/30/2025) for CPE.  Irem Stoneham A Siana Panameno

## 2024-07-30 NOTE — Assessment & Plan Note (Signed)
 Health maintenance reviewed and updated. Discussed nutrition, exercise. Check CMP, CBC today. Follow-up 1 year.

## 2024-07-31 LAB — HIV ANTIBODY (ROUTINE TESTING W REFLEX)
HIV 1&2 Ab, 4th Generation: NONREACTIVE
HIV FINAL INTERPRETATION: NEGATIVE

## 2024-07-31 LAB — IRON,TIBC AND FERRITIN PANEL
%SAT: 45 % (ref 16–45)
Ferritin: 14 ng/mL — ABNORMAL LOW (ref 16–232)
Iron: 152 ug/dL (ref 45–160)
TIBC: 337 ug/dL (ref 250–450)

## 2024-07-31 LAB — HEPATITIS C ANTIBODY: Hepatitis C Ab: NONREACTIVE

## 2024-08-01 NOTE — Telephone Encounter (Signed)
 Pt contacted back to find out her results Nurse Triage from [facility] has read patient her results. Patient understood results nothing further needed.

## 2024-08-02 ENCOUNTER — Telehealth: Payer: Self-pay | Admitting: Nurse Practitioner

## 2024-08-02 MED ORDER — TRIAMCINOLONE ACETONIDE 0.1 % EX CREA: 1.0000 | TOPICAL_CREAM | Freq: Two times a day (BID) | CUTANEOUS | 0 refills | Status: AC

## 2024-08-02 NOTE — Addendum Note (Signed)
 Addended by: Cristal Howatt A on: 08/02/2024 11:07 AM   Modules accepted: Orders

## 2024-08-02 NOTE — Telephone Encounter (Signed)
 Pt is wanting the Larger version of triamcinolone cream (KENALOG) 0.1 % [[id number]].   [id number] - [place], [place] - [address], [place] [address] Phone: [phone]  Fax: [phone] DEA #: [id number]   Pt at   [phone]

## 2024-08-02 NOTE — Telephone Encounter (Signed)
 I called and spoke with patient and notified her that a larger Rx was sent to pharmacy.

## 2024-08-13 ENCOUNTER — Ambulatory Visit
Admission: RE | Admit: 2024-08-13 | Discharge: 2024-08-13 | Disposition: A | Discharge status: Home / Self Care | Source: Ambulatory Visit | Attending: Nurse Practitioner | Admitting: Nurse Practitioner

## 2024-08-13 DIAGNOSIS — Z1231 Encounter for screening mammogram for malignant neoplasm of breast: Secondary | ICD-10-CM

## 2025-08-01 ENCOUNTER — Encounter: Admitting: Nurse Practitioner

## 2025-08-06 ENCOUNTER — Encounter: Admitting: Nurse Practitioner
